# Patient Record
Sex: Female | Born: 2005 | Race: White | Hispanic: No | Marital: Single | Smoking: Never smoker
Health system: Southern US, Community
[De-identification: ages and names within clinical notes are randomized; demographics above are authoritative.]

## PROBLEM LIST (undated history)

## (undated) DIAGNOSIS — J45909 Unspecified asthma, uncomplicated: Secondary | ICD-10-CM

## (undated) DIAGNOSIS — J40 Bronchitis, not specified as acute or chronic: Secondary | ICD-10-CM

---
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
## 2024-01-15 ENCOUNTER — Ambulatory Visit: Payer: Medicaid Other | Admitting: Dermatology

## 2024-01-27 ENCOUNTER — Ambulatory Visit: Admitting: Adult Health

## 2024-02-09 ENCOUNTER — Ambulatory Visit (INDEPENDENT_AMBULATORY_CARE_PROVIDER_SITE_OTHER): Admitting: Adult Health

## 2024-02-09 ENCOUNTER — Encounter: Payer: Self-pay | Admitting: Adult Health

## 2024-02-09 VITALS — BP 116/74 | HR 81 | Ht 62.0 in | Wt 125.5 lb

## 2024-02-09 DIAGNOSIS — N926 Irregular menstruation, unspecified: Secondary | ICD-10-CM

## 2024-02-09 DIAGNOSIS — Z3202 Encounter for pregnancy test, result negative: Secondary | ICD-10-CM

## 2024-02-09 DIAGNOSIS — Z113 Encounter for screening for infections with a predominantly sexual mode of transmission: Secondary | ICD-10-CM

## 2024-02-09 DIAGNOSIS — Z3041 Encounter for surveillance of contraceptive pills: Secondary | ICD-10-CM | POA: Diagnosis not present

## 2024-02-09 DIAGNOSIS — Z30011 Encounter for initial prescription of contraceptive pills: Secondary | ICD-10-CM | POA: Diagnosis not present

## 2024-02-09 LAB — POCT URINE PREGNANCY: Preg Test, Ur: NEGATIVE

## 2024-02-09 MED ORDER — LO LOESTRIN FE 1 MG-10 MCG / 10 MCG PO TABS: 1.0000 | ORAL_TABLET | Freq: Every day | ORAL | 3 refills | Status: AC

## 2024-02-09 NOTE — Progress Notes (Signed)
  Subjective:     Patient ID: Kimberly Mcintosh, female   DOB: [birth year], 18 y.o.   MRN: [id number]  HPI Kimberly Mcintosh is a 18 year old white female,single, G0P0, in requesting to get back on the birth control pills, has used LO Loestrin  in the past and wants that.  Review of Systems Periods are not regular Denies MI,stroke, DVT, breast cancer or migraine with aurea Reviewed past medical,surgical, social and family history. Reviewed medications and allergies.     Objective:   Physical Exam BP 116/74 (BP Location: Left Arm, Patient Position: Sitting, Cuff Size: Normal)   Pulse 81   Ht 5' 2 (1.575 m)   Wt 125 lb 8 oz (56.9 kg)   LMP 01/06/2024   BMI 22.95 kg/m  UPT is negative Skin warm and dry. Lungs: clear to ausculation bilaterally. Cardiovascular: regular rate and rhythm.      Upstream - 02/09/24 1127       Pregnancy Intention Screening   Does the patient want to become pregnant in the next year? No    Does the patient's partner want to become pregnant in the next year? No    Would the patient like to discuss contraceptive options today? Yes      Contraception Wrap Up   Current Method No Method - Other Reason    End Method Oral Contraceptive    Contraception Counseling Provided Yes          Assessment:     1. Screening examination for STD (sexually transmitted disease) Urine sent for GC/CHL - GC/Chlamydia Probe Amp  2. Negative pregnancy test - POCT urine pregnancy  3. Encounter for initial prescription of contraceptive pills (Primary) Will rx Lo Loestrin , 1 pack given to start today and rx sent Use condoms for 1 pack Meds ordered this encounter  Medications   Norethindrone-Ethinyl Estradiol-Fe Biphas (LO LOESTRIN FE ) 1 MG-10 MCG / 10 MCG tablet    Sig: Take 1 tablet by mouth daily. Take 1 daily by mouth    Dispense:  84 tablet    Refill:  3    BIN [id number], PCN [id number], GRP [id number],PI [id number]    Supervising Provider:   JAYNE MINDER H [[id number]]   Follow up in 3  months for ROS   4. Irregular periods/menstrual cycles     Plan:     Follow up in 3 months for ROS

## 2024-02-11 ENCOUNTER — Ambulatory Visit: Payer: Self-pay | Admitting: Adult Health

## 2024-02-11 LAB — GC/CHLAMYDIA PROBE AMP
Chlamydia trachomatis, NAA: NEGATIVE
Neisseria Gonorrhoeae by PCR: NEGATIVE

## 2024-05-10 ENCOUNTER — Ambulatory Visit: Admitting: Adult Health

## 2024-05-17 ENCOUNTER — Ambulatory Visit: Admitting: Adult Health

## 2024-05-17 ENCOUNTER — Encounter: Payer: Self-pay | Admitting: Adult Health

## 2024-05-17 VITALS — BP 96/65 | HR 80 | Ht 62.0 in | Wt 125.5 lb

## 2024-05-17 DIAGNOSIS — Z3041 Encounter for surveillance of contraceptive pills: Secondary | ICD-10-CM

## 2024-05-17 NOTE — Progress Notes (Signed)
°  Subjective:     Patient ID: YUI MULVANEY, female   DOB: 06-08-2005, 18 y.o.   MRN: [id number]  HPI Cythina is a 18 year old white female, single, G0P0, back in follow up on starting lo Loestrin  in September and is doing good.  PCP is Dr Alphonsa Review of Systems Doing good with lo Loestrin , no complaints Reviewed past medical,surgical, social and family history. Reviewed medications and allergies.     Objective:   Physical Exam BP 96/65 (BP Location: Left Arm, Patient Position: Sitting, Cuff Size: Normal)   Pulse 80   Ht 5' 2 (1.575 m)   Wt 125 lb 8 oz (56.9 kg)   BMI 22.95 kg/m     Skin warm and dry.  Lungs: clear to ausculation bilaterally. Cardiovascular: regular rate and rhythm.  Upstream - 05/17/24 1425       Pregnancy Intention Screening   Does the patient want to become pregnant in the next year? No    Does the patient's partner want to become pregnant in the next year? No    Would the patient like to discuss contraceptive options today? No      Contraception Wrap Up   Current Method Oral Contraceptive    End Method Oral Contraceptive    Contraception Counseling Provided Yes           Assessment:     1. Encounter for surveillance of contraceptive pills (Primary) Doing good with lo Loestrin , no complaints, has refills     Plan:     Follow up in 8 months or sooner if needed
